# Patient Record
Sex: Male | Born: 1980 | Hispanic: No | Smoking: Current every day smoker
Health system: Southern US, Community
[De-identification: ages and names within clinical notes are randomized; demographics above are authoritative.]

## PROBLEM LIST (undated history)

## (undated) DIAGNOSIS — F909 Attention-deficit hyperactivity disorder, unspecified type: Secondary | ICD-10-CM

## (undated) DIAGNOSIS — F32A Depression, unspecified: Secondary | ICD-10-CM

## (undated) DIAGNOSIS — F419 Anxiety disorder, unspecified: Secondary | ICD-10-CM

## (undated) HISTORY — DX: Depression, unspecified: F32.A

## (undated) HISTORY — DX: Anxiety disorder, unspecified: F41.9

## (undated) HISTORY — DX: Attention-deficit hyperactivity disorder, unspecified type: F90.9

---

## 2009-10-20 ENCOUNTER — Emergency Department (HOSPITAL_COMMUNITY): Admission: EM | Admit: 2009-10-20 | Discharge: 2009-10-20 | Payer: Self-pay | Admitting: Family Medicine

## 2010-06-07 ENCOUNTER — Emergency Department (HOSPITAL_COMMUNITY)
Admission: EM | Admit: 2010-06-07 | Discharge: 2010-06-07 | Payer: Self-pay | Source: Home / Self Care | Admitting: Emergency Medicine

## 2020-02-16 ENCOUNTER — Emergency Department (HOSPITAL_COMMUNITY)
Admission: EM | Admit: 2020-02-16 | Discharge: 2020-02-16 | Disposition: A | Discharge status: Home / Self Care | Payer: Medicaid Other | Attending: Emergency Medicine | Admitting: Emergency Medicine

## 2020-02-16 ENCOUNTER — Encounter (HOSPITAL_COMMUNITY): Payer: Self-pay | Admitting: Emergency Medicine

## 2020-02-16 ENCOUNTER — Ambulatory Visit (INDEPENDENT_AMBULATORY_CARE_PROVIDER_SITE_OTHER): Payer: Medicaid Other

## 2020-02-16 ENCOUNTER — Encounter (HOSPITAL_COMMUNITY): Payer: Self-pay | Admitting: *Deleted

## 2020-02-16 ENCOUNTER — Other Ambulatory Visit: Payer: Self-pay

## 2020-02-16 ENCOUNTER — Ambulatory Visit (HOSPITAL_COMMUNITY)
Admission: EM | Admit: 2020-02-16 | Discharge: 2020-02-16 | Disposition: A | Discharge status: Home / Self Care | Payer: Medicaid Other

## 2020-02-16 DIAGNOSIS — L03114 Cellulitis of left upper limb: Secondary | ICD-10-CM

## 2020-02-16 DIAGNOSIS — T148XXA Other injury of unspecified body region, initial encounter: Secondary | ICD-10-CM | POA: Diagnosis not present

## 2020-02-16 DIAGNOSIS — L089 Local infection of the skin and subcutaneous tissue, unspecified: Secondary | ICD-10-CM | POA: Diagnosis present

## 2020-02-16 DIAGNOSIS — Z5321 Procedure and treatment not carried out due to patient leaving prior to being seen by health care provider: Secondary | ICD-10-CM | POA: Diagnosis not present

## 2020-02-16 DIAGNOSIS — R55 Syncope and collapse: Secondary | ICD-10-CM

## 2020-02-16 DIAGNOSIS — M79642 Pain in left hand: Secondary | ICD-10-CM | POA: Diagnosis not present

## 2020-02-16 MED ORDER — DOXYCYCLINE HYCLATE 100 MG PO CAPS: 100.0000 mg | ORAL_CAPSULE | Freq: Two times a day (BID) | ORAL | 0 refills | Status: AC

## 2020-02-16 MED ORDER — NAPROXEN 500 MG PO TABS: 500.0000 mg | ORAL_TABLET | Freq: Two times a day (BID) | ORAL | 0 refills | Status: AC

## 2020-02-16 NOTE — Discharge Instructions (Addendum)
Please use Dial antibacterial soap to cleanse your wound 2-3 times daily. Use soaks with water and Dial soap for ~5-10 minutes at time. Try to express pus from the wound.
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
## 2020-02-20 ENCOUNTER — Encounter: Payer: Self-pay | Admitting: Neurology

## 2020-02-20 ENCOUNTER — Ambulatory Visit: Payer: Medicaid Other | Admitting: Neurology

## 2020-02-20 VITALS — BP 124/66 | HR 92 | Ht 70.0 in | Wt 156.0 lb

## 2020-02-20 DIAGNOSIS — R400 Somnolence: Secondary | ICD-10-CM

## 2020-02-20 DIAGNOSIS — R0683 Snoring: Secondary | ICD-10-CM | POA: Diagnosis not present

## 2020-02-20 DIAGNOSIS — G478 Other sleep disorders: Secondary | ICD-10-CM | POA: Diagnosis not present

## 2020-02-20 NOTE — Patient Instructions (Addendum)
As discussed, in order to evaluate you properly for possibility of underlying narcolepsy or a sleepiness condition, you will have to have extended sleep study testing in the form of a nighttime sleep study in the next day nap study.  In preparation for testing you would have to come off of your Adderall, reduce your caffeine intake, keep a set schedule at night and also be off of Suboxone.  I understand that this is not realistic right now.  For now, we will proceed with a nighttime sleep study to rule out an underlying sleep disorder such as obstructive sleep apnea.  If your sleep study shows obstructive sleep apnea, I will recommend treatment in the form of AutoPap or CPAP machine, as discussed.  Please remember to try to maintain good sleep hygiene, which means: Keep a regular sleep and wake schedule, try not to exercise or have a meal within 2 hours of your bedtime, try to keep your bedroom conducive for sleep, that is, cool and dark, without light distractors such as an illuminated alarm clock, and refrain from watching TV right before sleep or in the middle of the night and do not keep the TV or radio on during the night. Also, try not to use or play on electronic devices at bedtime, such as your cell phone, tablet PC or laptop. If you like to read at bedtime on an electronic device, try to dim the background light as much as possible. Do not eat in the middle of the night.   Please do not drive when sleepy. Please try to limit your caffeine to 2-3 servings per day if possible and try to hydrate well with water.  Please work on keeping a set schedule for your sleep and rise time.  We will plan on seeing you back after testing.
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
## 2020-02-25 ENCOUNTER — Telehealth: Payer: Self-pay

## 2020-02-25 NOTE — Telephone Encounter (Signed)
Unable to LVM for pt to call me back to schedule sleep study. Recording states number is no longer in service. Will try again later.

## 2020-03-04 ENCOUNTER — Telehealth: Payer: Self-pay

## 2020-03-04 NOTE — Telephone Encounter (Signed)
Unable to reach patient due to number no longer in service. Trying to reach pt to schedule sleep study.

## 2020-03-11 ENCOUNTER — Telehealth: Payer: Self-pay

## 2020-03-11 NOTE — Telephone Encounter (Signed)
LVM for pt to call me back to schedule sleep study   We have attempted to call the patient Four times (and called mother twice)  to schedule sleep study.  Patient has been unavailable at the phone numbers we have on file and has not returned our calls.  If patient calls back we will schedule them for their sleep study.

## 2020-04-13 ENCOUNTER — Ambulatory Visit (INDEPENDENT_AMBULATORY_CARE_PROVIDER_SITE_OTHER): Payer: Medicaid Other | Admitting: Neurology

## 2020-04-13 DIAGNOSIS — G478 Other sleep disorders: Secondary | ICD-10-CM

## 2020-04-13 DIAGNOSIS — R400 Somnolence: Secondary | ICD-10-CM

## 2020-04-13 DIAGNOSIS — R0683 Snoring: Secondary | ICD-10-CM

## 2020-04-13 DIAGNOSIS — G4720 Circadian rhythm sleep disorder, unspecified type: Secondary | ICD-10-CM

## 2020-04-16 ENCOUNTER — Telehealth: Payer: Self-pay | Admitting: Neurology

## 2020-04-16 NOTE — Telephone Encounter (Signed)
Pt's mother called wanting to know if the sleep results have come in. Please advise.

## 2020-04-20 NOTE — Telephone Encounter (Signed)
Called and LVM for mother letting her know results are still being reviewed by MD. Can take up to 2 weeks to be reviewed. Advised we will call once results are back.

## 2020-04-24 NOTE — Progress Notes (Signed)
Patient referred by Wallis Bamberg, PA from the ED, seen by me on 02/20/20, diagnostic PSG on 04/13/20.   Please call and notify the patient that the recent sleep study did not show any significant obstructive sleep apnea.  He did achieve all stages of sleep but had a delay in dream sleep onset, called REM sleep, and reduced percentage of dream sleep, both of which would argue against narcolepsy.  He had mild intermittent snoring.  As discussed during our visit, I would recommend that he have close follow-up with his primary care physician and psychiatrist.    Please send a copy of the report to Wallis Bamberg, [place].  Please reiterate to the patient, that he should not drive or use any dangerous equipment or machinery when feeling sleepy.  Please remind patient to try to maintain good sleep hygiene, which means: Keep a regular sleep and wake schedule and make enough time for sleep (7 1/2 to 8 1/2 hours for the average adult), try not to exercise or have a meal within 2 hours of your bedtime, try to keep your bedroom conducive for sleep, that is, cool and dark, without light distractors such as an illuminated alarm clock, and refrain from watching TV right before sleep or in the middle of the night and do not keep the TV or radio on during the night. If a nightlight is used, have it away from the visual field. Also, try not to use or play on electronic devices at bedtime, such as your cell phone, tablet PC or laptop. If you like to read at bedtime on an electronic device, try to dim the background light as much as possible. Do not eat in the middle of the night. Keep pets away from the bedroom environment. For stress relief, try meditation, deep breathing exercises (there are many books and CDs available), a white noise machine or fan can help to diffuse other noise distractors, such as traffic noise. Do not drink alcohol before bedtime, as it can disturb sleep and cause middle of the night awakenings. Never mix alcohol  and sedating medications! Avoid narcotic pain medication close to bedtime, as opioids/narcotics can suppress breathing drive and breathing effort.    Thanks,  Huston Foley, MD, PhD [facility])
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
## 2020-04-27 ENCOUNTER — Telehealth: Payer: Self-pay

## 2020-04-27 NOTE — Telephone Encounter (Signed)
-----   Message from Huston Foley, MD sent at 04/24/2020  4:33 PM EDT ----- Patient referred by Wallis Bamberg, PA from the ED, seen by me on 02/20/20, diagnostic PSG on 04/13/20.   Please call and notify the patient that the recent sleep study did not show any significant obstructive sleep apnea.  He did achieve all stages of sleep but had a delay in dream sleep onset, called REM sleep, and reduced percentage of dream sleep, both of which would argue against narcolepsy.  He had mild intermittent snoring.  As discussed during our visit, I would recommend that he have close follow-up with his primary care physician and psychiatrist.    Please send a copy of the report to Wallis Bamberg, [place].  Please reiterate to the patient, that he should not drive or use any dangerous equipment or machinery when feeling sleepy.  Please remind patient to try to maintain good sleep hygiene, which means: Keep a regular sleep and wake schedule and make enough time for sleep (7 1/2 to 8 1/2 hours for the average adult), try not to exercise or have a meal within 2 hours of your bedtime, try to keep your bedroom conducive for sleep, that is, cool and dark, without light distractors such as an illuminated alarm clock, and refrain from watching TV right before sleep or in the middle of the night and do not keep the TV or radio on during the night. If a nightlight is used, have it away from the visual field. Also, try not to use or play on electronic devices at bedtime, such as your cell phone, tablet PC or laptop. If you like to read at bedtime on an electronic device, try to dim the background light as much as possible. Do not eat in the middle of the night. Keep pets away from the bedroom environment. For stress relief, try meditation, deep breathing exercises (there are many books and CDs available), a white noise machine or fan can help to diffuse other noise distractors, such as traffic noise. Do not drink alcohol before bedtime, as it can  disturb sleep and cause middle of the night awakenings. Never mix alcohol and sedating medications! Avoid narcotic pain medication close to bedtime, as opioids/narcotics can suppress breathing drive and breathing effort.    Thanks,  Huston Foley, MD, PhD [facility] [place])

## 2020-04-27 NOTE — Telephone Encounter (Signed)
I called pt, spoke to pt's mother Velna Hatchet, per [facility]. I advised pt that Dr. Frances Furbish reviewed pt's sleep study and found that he did not show any significant osa. He had mild intermittent snoring. He did have a delayed onset REM sleep and reduced percentage of dream sleep that would argue against narcolepsy. Dr. Frances Furbish recommends that pt follow up with his PCP and psychiatrist. I reviewed sleep hygiene recommendations with the pt's mother, including trying to keep a regular sleep wake schedule, avoiding electronics in the bedroom, keeping the bedroom cool, dark, and quiet, and avoiding eating or exercising within 2 hours of bedtime as well as eating in the middle of the night. I advised pt's mother to keep pets out of the bedroom. I discussed with pt's mother the importance of stress relief and to try meditation, deep breathing exercises, and/or a white noise machine or fan to diffuse other noise distractors. I advised pt to not drink alcohol before bedtime and to never mix alcohol and sedating medications. Pt's mother was advised to avoid narcotic pain medication close to bedtime. I advised pt's mother that a copy of these sleep study results will be sent to Wallis Bamberg, [place]. Pt's mother verbalized understanding of results. Pt's mother had no questions at this time but was encouraged to call back if questions arise.
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
